# Patient Record
Sex: Female | Born: 1995 | Race: Asian | Hispanic: No | Marital: Single | Smoking: Never smoker
Health system: Southern US, Community
[De-identification: ages and names within clinical notes are randomized; demographics above are authoritative.]

---
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
## 2017-06-14 ENCOUNTER — Other Ambulatory Visit: Payer: Self-pay | Admitting: Family Medicine

## 2017-06-14 DIAGNOSIS — E049 Nontoxic goiter, unspecified: Secondary | ICD-10-CM

## 2017-06-21 ENCOUNTER — Ambulatory Visit
Admission: RE | Admit: 2017-06-21 | Discharge: 2017-06-21 | Disposition: A | Discharge status: Home / Self Care | Payer: BLUE CROSS/BLUE SHIELD | Source: Ambulatory Visit | Attending: Family Medicine | Admitting: Family Medicine

## 2017-06-21 DIAGNOSIS — E049 Nontoxic goiter, unspecified: Secondary | ICD-10-CM

## 2018-04-15 IMAGING — US US THYROID
1 series · 14 of 25 positions shown · non-contrast
Comparison: None.

CLINICAL DATA: 20-year-old female with a history of enlarged
thyroid

EXAM:
THYROID ULTRASOUND
TECHNIQUE: Ultrasound examination of the thyroid gland and adjacent soft
tissues was performed.

[Series 1: us thyroid · 0.04mm/px · 14 of 46 slices shown]
[im 1/46]
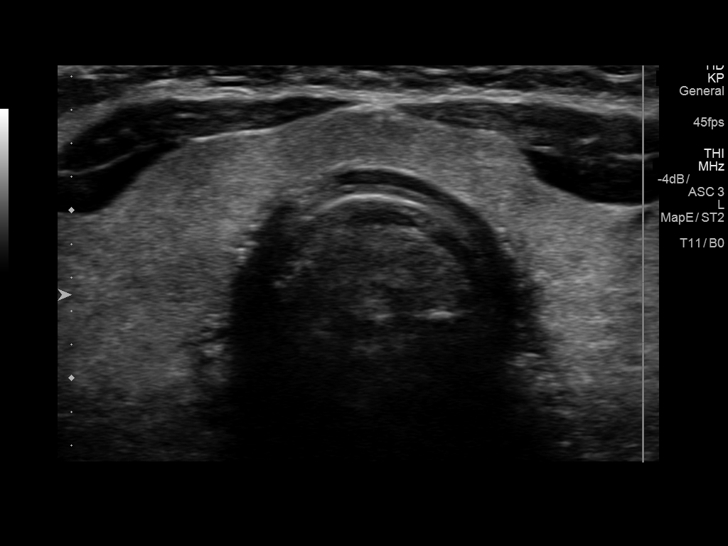
[im 4/46]
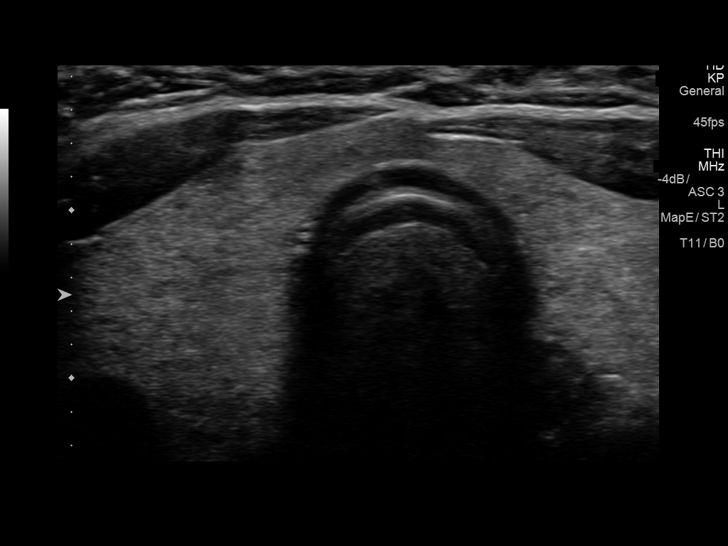
[im 8/46]
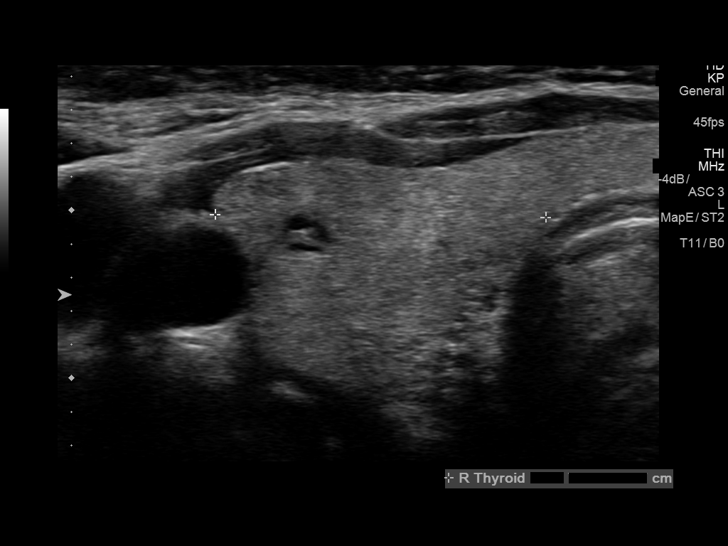
[im 12/46]
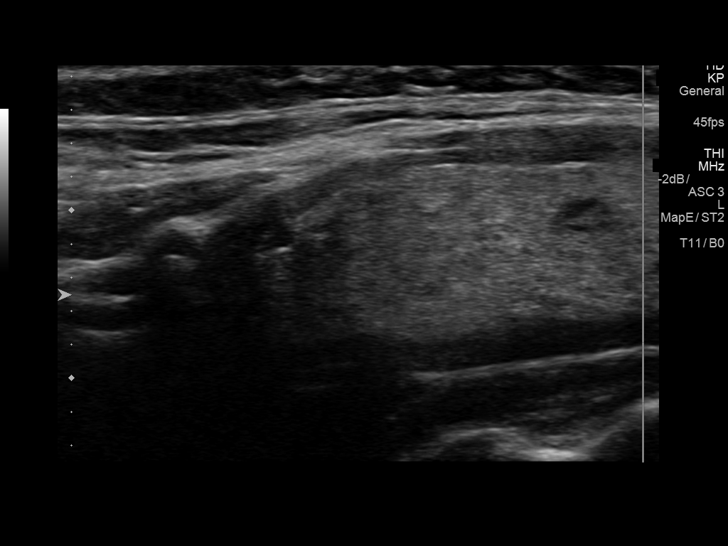
[im 16/46]
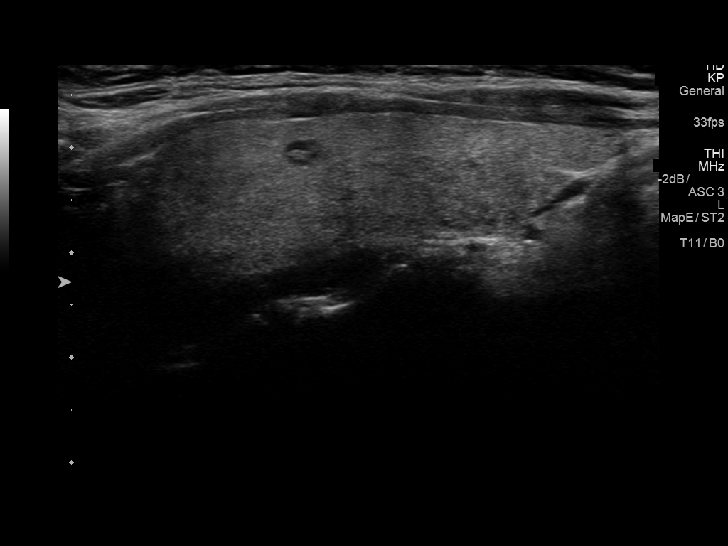
[im 17/46]
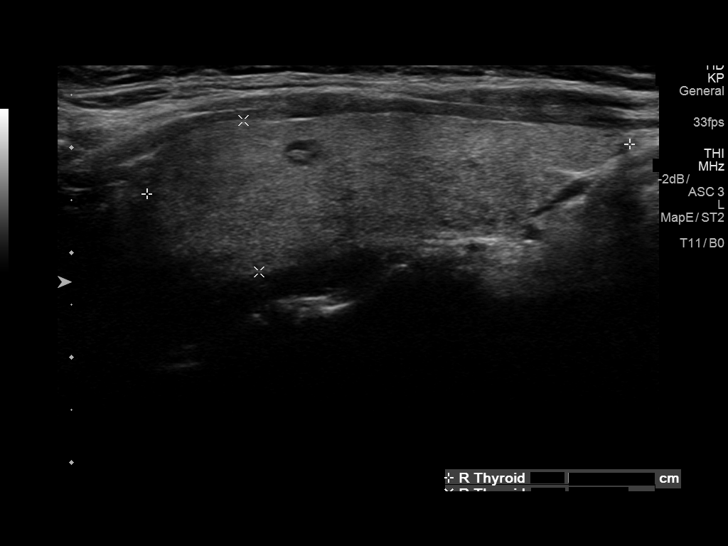
[im 21/46]
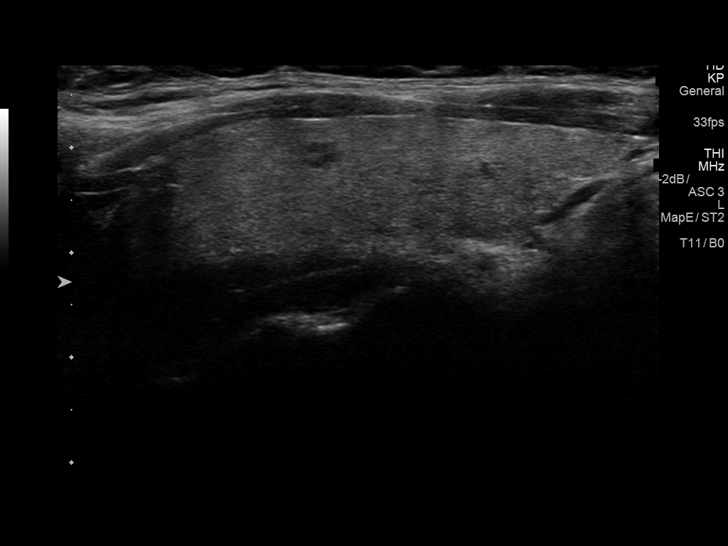
[im 25/46]
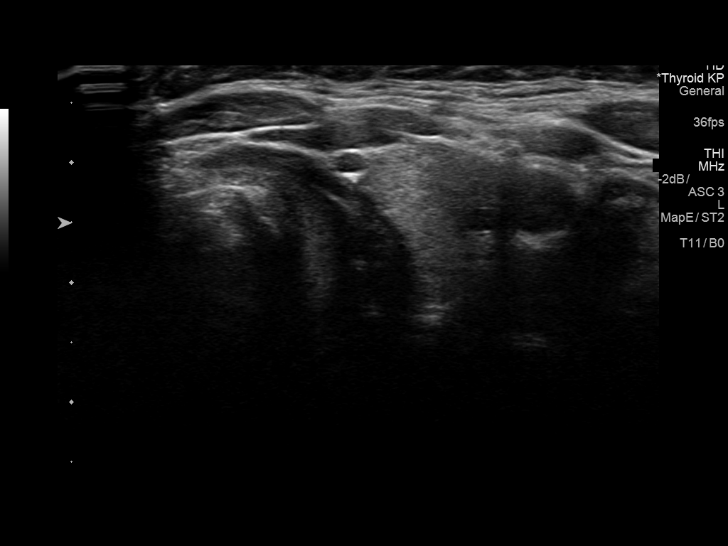
[im 29/46]
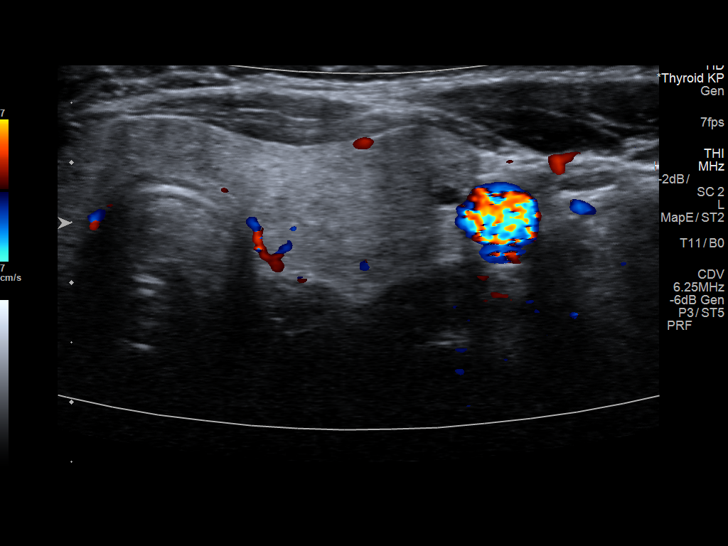
[im 31/46]
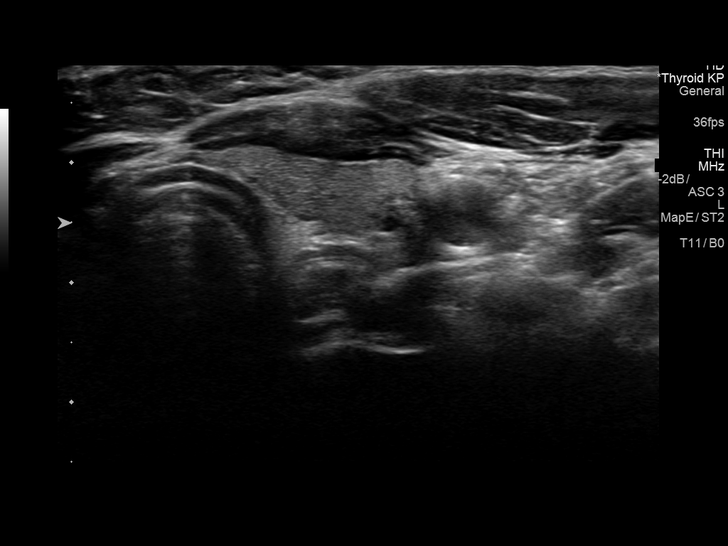
[im 34/46]
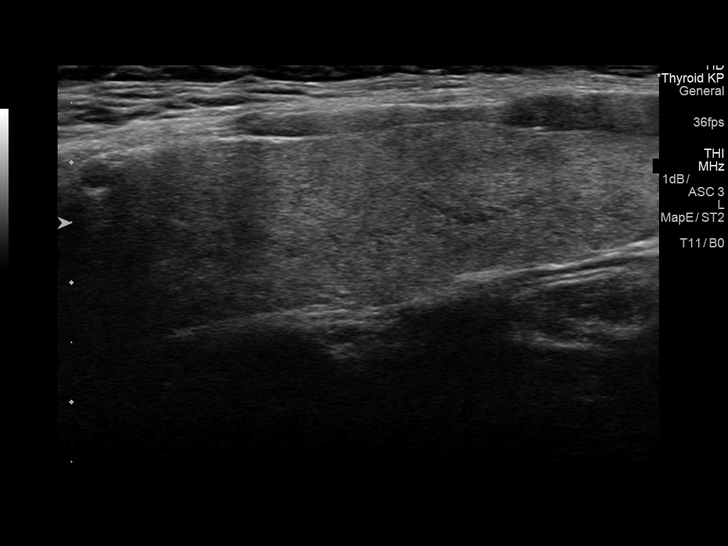
[im 38/46]
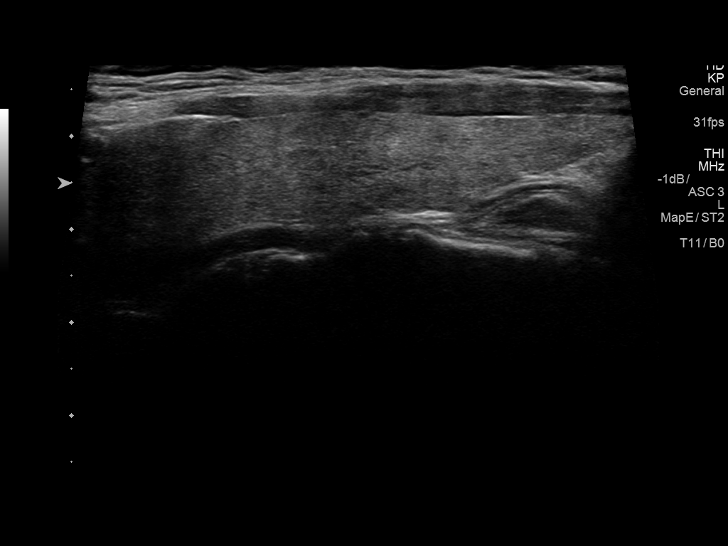
[im 42/46]
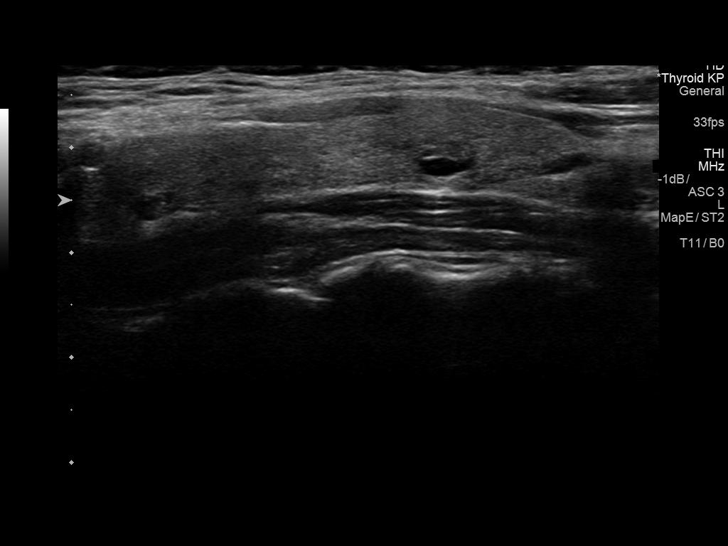
[im 46/46]
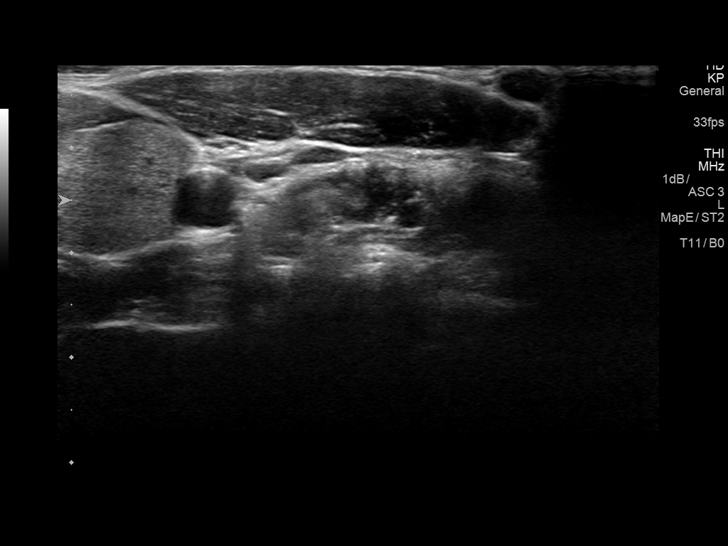

[14 of 25 positions shown; findings below may reference images not displayed]

FINDINGS: Parenchymal Echotexture: Mildly heterogenous

Isthmus: 0.3 cm

Right lobe: 4.6 cm x 1.5 cm x 2.0 cm

Left lobe: 5.9 cm x 1.3 cm x 2.1 cm

_________________________________________________________

Estimated total number of nodules >/= 1 cm: 0

Number of spongiform nodules >/=  2 cm not described below (TR1): 0

Number of mixed cystic and solid nodules >/= 1.5 cm not described
below (TR2): 0

_________________________________________________________

Bilateral benign cysts/ colloid nodules.

No adenopathy
IMPRESSION: No thyroid nodule meets criteria for biopsy or surveillance, as
designated by the newly established ACR TI-RADS criteria.

Recommendations follow those established by the new ACR TI-RADS
criteria ([HOSPITAL] 6664;[DATE]).

## 2021-03-09 ENCOUNTER — Other Ambulatory Visit (HOSPITAL_COMMUNITY)
Admission: RE | Admit: 2021-03-09 | Discharge: 2021-03-09 | Disposition: A | Discharge status: Home / Self Care | Payer: BC Managed Care – PPO | Source: Ambulatory Visit | Attending: Family Medicine | Admitting: Family Medicine

## 2021-03-09 ENCOUNTER — Other Ambulatory Visit: Payer: Self-pay | Admitting: Family Medicine

## 2021-03-09 DIAGNOSIS — Z0000 Encounter for general adult medical examination without abnormal findings: Secondary | ICD-10-CM | POA: Diagnosis not present

## 2021-03-11 LAB — CYTOLOGY - PAP: Diagnosis: NEGATIVE

## 2021-04-28 ENCOUNTER — Ambulatory Visit (INDEPENDENT_AMBULATORY_CARE_PROVIDER_SITE_OTHER): Payer: BC Managed Care – PPO | Admitting: Allergy

## 2021-04-28 ENCOUNTER — Other Ambulatory Visit: Payer: Self-pay

## 2021-04-28 ENCOUNTER — Encounter: Payer: Self-pay | Admitting: Allergy

## 2021-04-28 VITALS — BP 122/78 | HR 92 | Temp 97.9°F | Resp 18 | Ht 63.0 in | Wt 157.5 lb

## 2021-04-28 DIAGNOSIS — J3089 Other allergic rhinitis: Secondary | ICD-10-CM

## 2021-04-28 DIAGNOSIS — R12 Heartburn: Secondary | ICD-10-CM

## 2021-04-28 DIAGNOSIS — R053 Chronic cough: Secondary | ICD-10-CM | POA: Insufficient documentation

## 2021-04-28 DIAGNOSIS — J302 Other seasonal allergic rhinitis: Secondary | ICD-10-CM | POA: Insufficient documentation

## 2021-04-28 MED ORDER — AZELASTINE HCL 0.1 % NA SOLN: 1.0000 | Freq: Two times a day (BID) | NASAL | 5 refills | Status: DC | PRN

## 2021-04-28 MED ORDER — LEVOCETIRIZINE DIHYDROCHLORIDE 5 MG PO TABS: 5.0000 mg | ORAL_TABLET | Freq: Every evening | ORAL | 5 refills | Status: AC

## 2021-04-28 MED ORDER — ALBUTEROL SULFATE HFA 108 (90 BASE) MCG/ACT IN AERS: 2.0000 | INHALATION_SPRAY | RESPIRATORY_TRACT | 1 refills | Status: DC | PRN

## 2021-04-28 NOTE — Assessment & Plan Note (Signed)
Main complaint of chronic coughing with clear phlegm, chest tightness, shortness of breath, wheezing and nocturnal awakening for a few years.  Worse in the morning after brushing teeth and after meals.  Symptoms last for 1 to 2 hours.  Tried taking Nexium for 14 days with no benefit.  No recent chest x-ray.  Never tried inhalers.  Concerned about allergic triggers.  Today's skin testing showed: Positive to grass, trees, mold, dust mites, cat, cockroach. Borderline to feathers. Negative to common foods.  Today's spirometry was normal with 6% improvement in FEV1 post bronchodilator treatment.  Clinically feeling unchanged. . The most common causes of chronic cough include the following: upper airway cough syndrome (UACS) which is caused by variety of rhinitis conditions; asthma; gastroesophageal reflux disease (GERD); chronic bronchitis from cigarette smoking or other inhaled environmental irritants; non-asthmatic eosinophilic bronchitis; and bronchiectasis.   The history and physical examination suggest that her cough is multifactorial with contribution from heartburn, PND and possibly undiagnosed reactive airway disease.  . May use albuterol rescue inhaler 2 puffs or nebulizer every 4 to 6 hours as needed for shortness of breath, chest tightness, coughing, and wheezing. Monitor frequency of use and see if symptoms improve.  Start environmental control measures as below.  Use over the counter antihistamines such as Xyzal (levocetirizine) daily as needed. May take twice a day during allergy flares. May switch antihistamines every few months.  Use azelastine nasal spray 1-2 sprays per nostril twice a day as needed for runny nose/drainage.  Nasal saline spray (i.e., Simply Saline) or nasal saline lavage (i.e., NeilMed) is recommended as needed and prior to medicated nasal sprays.  See handout for lifestyle and dietary modifications.

## 2021-04-28 NOTE — Assessment & Plan Note (Signed)
See handout for lifestyle and dietary modifications. 

## 2021-04-28 NOTE — Progress Notes (Signed)
New Patient Note  RE: Sonya Bullock MRN: [id number] DOB: Mar 17, 1996 Date of Office Visit: 04/28/2021  Consult requested by: Deatra James, MD Primary care provider: Deatra James, MD  Chief Complaint: Cough (Coughing for a couple of years and no allergy medications work, after she eats, brush teeth )  History of Present Illness: I had the pleasure of seeing Sonya Bullock for initial evaluation at the [facility] on 04/28/2021. She is a 25 y.o. female, who is referred here by Deatra James, MD for the evaluation of cough.  She reports symptoms of coughing with clear phlegm, chest tightness, shortness of breath, wheezing, nocturnal awakenings for a few years. Worse in the mornings after she brushes her teeth and after meals. Sometimes it lasts 1-2 hours before symptoms resolve.   Current medications include none. She tried the following inhalers: none. In the last month, frequency of symptoms: daily. Interference with physical activity: yes. Sleep is undisturbed. In the last 12 months, emergency room visits/urgent care visits/doctor office visits or hospitalizations due to respiratory issues: no. In the last 12 months, oral steroids courses: no. Lifetime history of hospitalization for respiratory issues: no. Prior intubations: no. History of pneumonia: no. She was not evaluated by allergist/pulmonologist in the past. Smoking exposure: no. Up to date with flu vaccine: yes. Up to date with COVID-19 vaccine: yes. Prior Covid-19 infection: 2 years ago. History of reflux: sometimes and took Nexium for 14 days with some benefit but the coughing persisted. No recent CXR.  She reports symptoms of PND, nasal congestion, rhinorrhea. Symptoms have been going on for 2 years. The symptoms are present all year around with worsening in winter and fall. Anosmia: no. Headache: no. She has used allegra, Claritin, zyrtec, Flonase with minimal  improvement in symptoms. Sinus infections: no. Previous work  up includes: no. Previous ENT evaluation: no. Previous sinus imaging: no. History of nasal polyps: no.  Assessment and Plan: Sonya Bullock is a 25 y.o. female with: Chronic coughing Main complaint of chronic coughing with clear phlegm, chest tightness, shortness of breath, wheezing and nocturnal awakening for a few years.  Worse in the morning after brushing teeth and after meals.  Symptoms last for 1 to 2 hours.  Tried taking Nexium for 14 days with no benefit.  No recent chest x-ray.  Never tried inhalers.  Concerned about allergic triggers. Today's skin testing showed: Positive to grass, trees, mold, dust mites, cat, cockroach. Borderline to feathers. Negative to common foods. Today's spirometry was normal with 6% improvement in FEV1 post bronchodilator treatment.  Clinically feeling unchanged. The most common causes of chronic cough include the following: upper airway cough syndrome (UACS) which is caused by variety of rhinitis conditions; asthma; gastroesophageal reflux disease (GERD); chronic bronchitis from cigarette smoking or other inhaled environmental irritants; non-asthmatic eosinophilic bronchitis; and bronchiectasis.  The history and physical examination suggest that her cough is multifactorial with contribution from heartburn, PND and possibly undiagnosed reactive airway disease.  May use albuterol rescue inhaler 2 puffs or nebulizer every 4 to 6 hours as needed for shortness of breath, chest tightness, coughing, and wheezing. Monitor frequency of use and see if symptoms improve. Start environmental control measures as below. Use over the counter antihistamines such as Xyzal (levocetirizine) daily as needed. May take twice a day during allergy flares. May switch antihistamines every few months. Use azelastine nasal spray 1-2 sprays per nostril twice a day as needed for runny nose/drainage. Nasal saline spray (i.e., Simply Saline) or nasal saline  lavage (i.e., NeilMed) is recommended as  needed and prior to medicated nasal sprays. See handout for lifestyle and dietary modifications.  Other allergic rhinitis Perennial rhinitis symptoms for 2 years with worse in the winter and fall.  Tried over-the-counter antihistamines and Flonase with minimal benefit.  No prior allergy/ENT evaluation. Today's skin testing was: Positive to grass, trees, mold, dust mites, cat, cockroach. Borderline to feathers. Start environmental control measures as below. Use over the counter antihistamines such as Xyzal (levocetirizine) daily as needed. May take twice a day during allergy flares. May switch antihistamines every few months. Use azelastine nasal spray 1-2 sprays per nostril twice a day as needed for runny nose/drainage. Nasal saline spray (i.e., Simply Saline) or nasal saline lavage (i.e., NeilMed) is recommended as needed and prior to medicated nasal sprays. Consider allergy injections for long term control if above medications do not help the symptoms - handout given.   Heartburn See handout for lifestyle and dietary modifications.  Return in about 2 months (around 06/28/2021).  Meds ordered this encounter  Medications   levocetirizine (XYZAL ALLERGY 24HR) 5 MG tablet    Sig: Take 1 tablet (5 mg total) by mouth every evening.    Dispense:  30 tablet    Refill:  5   azelastine (ASTELIN) 0.1 % nasal spray    Sig: Place 1-2 sprays into both nostrils 2 (two) times daily as needed (nasal drainage). Use in each nostril as directed    Dispense:  30 mL    Refill:  5   albuterol (VENTOLIN HFA) 108 (90 Base) MCG/ACT inhaler    Sig: Inhale 2 puffs into the lungs every 4 (four) hours as needed for wheezing or shortness of breath (coughing fits).    Dispense:  18 g    Refill:  1    Lab Orders  No laboratory test(s) ordered today    Other allergy screening: Food allergy: no Medication allergy: no Hymenoptera allergy: no Urticaria: no Eczema:no History of recurrent infections suggestive  of immunodeficency: no  Diagnostics: Spirometry:  Tracings reviewed. Her effort: Good reproducible efforts. FVC: 3.11L FEV1: 2.68L, 96% predicted FEV1/FVC ratio: 86% Interpretation: Spirometry consistent with normal pattern with 6% improvement in FEV1 post bronchodilator treatment. Clinically feeling unchanged.   Please see scanned spirometry results for details.  Skin Testing: Environmental allergy panel and select foods. Positive to grass, trees, mold, dust mites, cat, cockroach. Borderline to feathers. Negative to common foods. Results discussed with patient/family.  Airborne Adult Perc - 04/28/21 1421     Time Antigen Placed 1422    Allergen Manufacturer Waynette Buttery    Location Back    Number of Test 59    1. Control-Buffer 50% Glycerol Negative    2. Control-Histamine 1 mg/ml 2+    3. Albumin saline Negative    4. Bahia Negative    5. French Southern Territories Negative    6. Johnson 2+    7. Kentucky Blue 2+    8. Meadow Fescue Negative    9. Perennial Rye Negative    10. Sweet Vernal Negative    11. Timothy 2+    12. Cocklebur Negative    13. Burweed Marshelder Negative    14. Ragweed, short Negative    15. Ragweed, Giant Negative    16. Plantain,  English Negative    17. Lamb's Quarters Negative    18. Sheep Sorrell Negative    19. Rough Pigweed Negative    20. Marsh Elder, Rough Negative    21. Mugwort, Common  Negative    22. Ash mix Negative    23. Birch mix Negative    24. Beech American Negative    25. Box, Elder Negative    26. Cedar, red Negative    27. Cottonwood, Guinea[place]au Negative    28. Elm mix Negative    29. Hickory 4+    30. Maple mix Negative    31. Oak, [place] mix Negative    32. Pecan Pollen 4+    33. Pine mix Negative    34. Sycamore Guinea[place]iss[place] --   +/-   35. Walnut, Black Pollen Negative    36. Alternaria alternata Negative    37. Cladosporium Herbarum Negative    38. Aspergillus mix Negative    39. Penicillium mix Negative    40. Bipolaris sorokiniana  (Helminthosporium) Negative    41. Drechslera spicifera (Curvularia) Negative    42. Mucor plumbeus Negative    43. Fusarium moniliforme Negative    44. Aureobasidium pullulans (pullulara) Negative    45. Rhizopus oryzae Negative    46. Botrytis cinera Negative    47. Epicoccum nigrum Negative    48. Phoma betae Negative    49. Candida Albicans 2+    50. Trichophyton mentagrophytes Negative    51. Mite, D Farinae  5,000 AU/ml 3+    52. Mite, D Pteronyssinus  5,000 AU/ml Negative    53. Cat Hair 10,000 BAU/ml Negative    54.  Dog Epithelia Negative    55. Mixed Feathers --   +/-   56. Horse Epithelia Negative    57. Cockroach, German Negative    58. Mouse Negative    59. Tobacco Leaf Negative             Food Perc - 04/28/21 1422       Test Information   Time Antigen Placed 1422    Allergen Manufacturer Waynette Buttery    Location Back    Number of allergen test 10      Food   1. Peanut Negative    2. Soybean food Negative    3. Wheat, whole Negative    4. Sesame Negative    5. Milk, cow Negative    6. Egg White, chicken Negative    7. Casein Negative    8. Shellfish mix Negative    9. Fish mix Negative    10. Cashew Negative             Intradermal - 04/28/21 1452     Time Antigen Placed 1452    Allergen Manufacturer Greer    Location Arm    Number of Test 11    Control Negative    French Southern Territ[place] Negative    Ragweed mix Negative    Weed mix Negative    Mold 1 Negative    Mold 2 Negative    Mold 3 --   +/-   Mold 4 --   +/-   Cat 4+    Dog Negative    Cockroach 3+             Past Medical History: Patient Active Problem List   Diagnosis Date Noted   Chronic coughing 04/28/2021   Other allergic rhinitis 04/28/2021   Heartburn 04/28/2021    History reviewed. No pertinent past medical history. Past Surgical History: History reviewed. No pertinent surgical history. Medication List:  Current Outpatient Medications  Medication Sig Dispense Refill    albuterol (VENTOLIN HFA) 108 (90 Base) MCG/ACT inhaler Inhale 2 puffs into the  lungs every 4 (four) hours as needed for wheezing or shortness of breath (coughing fits). 18 g 1   azelastine (ASTELIN) 0.1 % nasal spray Place 1-2 sprays into both nostrils 2 (two) times daily as needed (nasal drainage). Use in each nostril as directed 30 mL 5   levocetirizine (XYZAL ALLERGY 24HR) 5 MG tablet Take 1 tablet (5 mg total) by mouth every evening. 30 tablet 5   VIENVA 0.1-20 MG-MCG tablet Take 1 tablet by mouth daily.     Vitamin D, Ergocalciferol, (DRISDOL) 1.25 MG (50000 UNIT) CAPS capsule Take by mouth.     No current facility-administered medications for this visit.   Allergies: No Known Allergies Social History: Social History   Socioeconomic History   Marital status: Single    Spouse name: Not on file   Number of children: Not on file   Years of education: Not on file   Highest education level: Not on file  Occupational History   Not on file  Tobacco Use   Smoking status: Never   Smokeless tobacco: Never  Vaping Use   Vaping Use: Never used  Substance and Sexual Activity   Alcohol use: Not Currently    Alcohol/week: 3.0 standard drinks    Types: 3 Glasses of wine per week   Drug use: Never   Sexual activity: Not on file  Other Topics Concern   Not on file  Social History Narrative   Not on file   Social Determinants of Health   Financial Resource Strain: Not on file  Food Insecurity: Not on file  Transportation Needs: Not on file  Physical Activity: Not on file  Stress: Not on file  Social Connections: Not on file   Lives in a 23 year old house. Smoking: denies Occupation: Equities traderCNA  Environmental HistorySurveyor, minerals: Water Damage/mildew in the house: no Engineer, civil (consulting)Carpet in the family room: no Carpet in the bedroom: yes Heating:  gas and electric Cooling: central Pet: yes 2 cats x 1 month  Family History: Family History  Problem Relation Age of Onset   Asthma Mother    Urticaria Sister     Allergic rhinitis Neg Hx    Eczema Neg Hx    Problem                               Relation Asthma                                   Mother  Eczema                                No  Food allergy                          Sister  Allergic rhino conjunctivitis     No  Review of Systems  Constitutional:  Negative for appetite change, chills, fever and unexpected weight change.  HENT:  Positive for congestion, postnasal drip and rhinorrhea.   Eyes:  Negative for itching.  Respiratory:  Positive for cough. Negative for chest tightness, shortness of breath and wheezing.   Cardiovascular:  Negative for chest pain.  Gastrointestinal:  Negative for abdominal pain.  Genitourinary:  Negative for difficulty urinating.  Skin:  Negative for rash.  Allergic/Immunologic: Positive for environmental allergies.  Neurological:  Negative for headaches.   Objective: BP 122/78 (BP Location: Right Arm, Patient Position: Sitting, Cuff Size: Normal)   Pulse 92   Temp 97.9 F (36.6 C) (Temporal)   Resp 18   Ht  (1.6 m)   Wt 157 lb 8 oz (71.4 kg)   SpO2 99%   BMI 27.90 kg/m  Body mass index is 27.9 kg/m. Physical Exam Vitals and nursing note reviewed.  Constitutional:      Appearance: Normal appearance. She is well-developed.  HENT:     Head: Normocephalic and atraumatic.     Right Ear: External ear normal.     Left Ear: External ear normal.     Nose: Nose normal.     Mouth/Throat:     Mouth: Mucous membranes are moist.     Pharynx: Oropharynx is clear.  Eyes:     Conjunctiva/sclera: Conjunctivae normal.  Cardiovascular:     Rate and Rhythm: Normal rate and regular rhythm.     Heart sounds: Normal heart sounds. No murmur heard.   No friction rub. No gallop.  Pulmonary:     Effort: Pulmonary effort is normal.     Breath sounds: Normal breath sounds. No wheezing, rhonchi or rales.  Abdominal:     Palpations: Abdomen is soft.  Musculoskeletal:     Cervical back: Neck supple.   Skin:    General: Skin is warm.     Findings: No rash.  Neurological:     Mental Status: She is alert and oriented to person, place, and time.  Psychiatric:        Behavior: Behavior normal.  The plan was reviewed with the patient/family, and all questions/concerned were addressed.  It was my pleasure to see Sonya Bullock today and participate in her care. Please feel free to contact me with any questions or concerns.  Sincerely,  Wyline Mood, DO Allergy & Immunology  [facility] office: [phone] [facility] office: [phone]

## 2021-04-28 NOTE — Patient Instructions (Addendum)
Today's skin testing showed: Positive to grass, trees, mold, dust mites, cat, cockroach. Borderline to feathers. Negative to common foods. Results given.   Coughing: The most common causes of chronic cough include the following: upper airway cough syndrome (UACS) which is caused by variety of rhinitis conditions; asthma; gastroesophageal reflux disease (GERD); chronic bronchitis from cigarette smoking or other inhaled environmental irritants; non-asthmatic eosinophilic bronchitis; and bronchiectasis.  May use albuterol rescue inhaler 2 puffs or nebulizer every 4 to 6 hours as needed for shortness of breath, chest tightness, coughing, and wheezing. Monitor frequency of use.   Environmental allergies Start environmental control measures as below. Use over the counter antihistamines such as Xyzal (levocetirizine) daily as needed. May take twice a day during allergy flares. May switch antihistamines every few months. Use azelastine nasal spray 1-2 sprays per nostril twice a day as needed for runny nose/drainage. Nasal saline spray (i.e., Simply Saline) or nasal saline lavage (i.e., NeilMed) is recommended as needed and prior to medicated nasal sprays.  Consider allergy injections for long term control if above medications do not help the symptoms - handout given.   Heartburn: See handout for lifestyle and dietary modifications.  Follow up in 2 months or sooner if needed.   Reducing Pollen Exposure Pollen seasons: trees (spring), grass (summer) and ragweed/weeds (fall). Keep windows closed in your home and car to lower pollen exposure.  Install air conditioning in the bedroom and throughout the house if possible.  Avoid going out in dry windy days - especially early morning. Pollen counts are highest between 5 - 10 AM and on dry, hot and windy days.  Save outside activities for late afternoon or after a heavy rain, when pollen levels are lower.  Avoid mowing of grass if you have grass pollen  allergy. Be aware that pollen can also be transported indoors on people and pets.  Dry your clothes in an automatic dryer rather than hanging them outside where they might collect pollen.  Rinse hair and eyes before bedtime. Mold Control Mold and fungi can grow on a variety of surfaces provided certain temperature and moisture conditions exist.  Outdoor molds grow on plants, decaying vegetation and soil. The major outdoor mold, Alternaria and Cladosporium, are found in very high numbers during hot and dry conditions. Generally, a late summer - fall peak is seen for common outdoor fungal spores. Rain will temporarily lower outdoor mold spore count, but counts rise rapidly when the rainy period ends. The most important indoor molds are Aspergillus and Penicillium. Dark, humid and poorly ventilated basements are ideal sites for mold growth. The next most common sites of mold growth are the bathroom and the kitchen. Outdoor (Seasonal) Mold Control Use air conditioning and keep windows closed. Avoid exposure to decaying vegetation. Avoid leaf raking. Avoid grain handling. Consider wearing a face mask if working in moldy areas.  Indoor (Perennial) Mold Control  Maintain humidity below 50%. Get rid of mold growth on hard surfaces with water, detergent and, if necessary, 5% bleach (do not mix with other cleaners). Then dry the area completely. If mold covers an area more than 10 square feet, consider hiring an indoor environmental professional. For clothing, washing with soap and water is best. If moldy items cannot be cleaned and dried, throw them away. Remove sources e.g. contaminated carpets. Repair and seal leaking roofs or pipes. Using dehumidifiers in damp basements may be helpful, but empty the water and clean units regularly to prevent mildew from forming. All rooms, especially basements,  bathrooms and kitchens, require ventilation and cleaning to deter mold and mildew growth. Avoid carpeting on  concrete or damp floors, and storing items in damp areas. Control of House Dust Mite Allergen Dust mite allergens are a common trigger of allergy and asthma symptoms. While they can be found throughout the house, these microscopic creatures thrive in warm, humid environments such as bedding, upholstered furniture and carpeting. Because so much time is spent in the bedroom, it is essential to reduce mite levels there.  Encase pillows, mattresses, and box springs in special allergen-proof fabric covers or airtight, zippered plastic covers.  Bedding should be washed weekly in hot water (130 F) and dried in a hot dryer. Allergen-proof covers are available for comforters and pillows that can't be regularly washed.  Wash the allergy-proof covers every few months. Minimize clutter in the bedroom. Keep pets out of the bedroom.  Keep humidity less than 50% by using a dehumidifier or air conditioning. You can buy a humidity measuring device called a hygrometer to monitor this.  If possible, replace carpets with hardwood, linoleum, or washable area rugs. If that's not possible, vacuum frequently with a vacuum that has a HEPA filter. Remove all upholstered furniture and non-washable window drapes from the bedroom. Remove all non-washable stuffed toys from the bedroom.  Wash stuffed toys weekly. Pet Allergen Avoidance: Contrary to popular opinion, there are no "hypoallergenic" breeds of dogs or cats. That is because people are not allergic to an animal's hair, but to an allergen found in the animal's saliva, dander (dead skin flakes) or urine. Pet allergy symptoms typically occur within minutes. For some people, symptoms can build up and become most severe 8 to 12 hours after contact with the animal. People with severe allergies can experience reactions in public places if dander has been transported on the pet owners' clothing. Keeping an animal outdoors is only a partial solution, since homes with pets in the  yard still have higher concentrations of animal allergens. Before getting a pet, ask your allergist to determine if you are allergic to animals. If your pet is already considered part of your family, try to minimize contact and keep the pet out of the bedroom and other rooms where you spend a great deal of time. As with dust mites, vacuum carpets often or replace carpet with a hardwood floor, tile or linoleum. High-efficiency particulate air (HEPA) cleaners can reduce allergen levels over time. While dander and saliva are the source of cat and dog allergens, urine is the source of allergens from rabbits, hamsters, mice and [place] pigs; so ask a non-allergic family member to clean the animal's cage. If you have a pet allergy, talk to your allergist about the potential for allergy immunotherapy (allergy shots). This strategy can often provide long-term relief. Cockroach Allergen Avoidance Cockroaches are often found in the homes of densely populated urban areas, schools or commercial buildings, but these creatures can lurk almost anywhere. This does not mean that you have a dirty house or living area. Block all areas where roaches can enter the home. This includes crevices, wall cracks and windows.  Cockroaches need water to survive, so fix and seal all leaky faucets and pipes. Have an exterminator go through the house when your family and pets are gone to eliminate any remaining roaches. Keep food in lidded containers and put pet food dishes away after your pets are done eating. Vacuum and sweep the floor after meals, and take out garbage and recyclables. Use lidded garbage containers  in the kitchen. Wash dishes immediately after use and clean under stoves, refrigerators or toasters where crumbs can accumulate. Wipe off the stove and other kitchen surfaces and cupboards regularly.

## 2021-04-28 NOTE — Assessment & Plan Note (Signed)
Perennial rhinitis symptoms for 2 years with worse in the winter and fall.  Tried over-the-counter antihistamines and Flonase with minimal benefit.  No prior allergy/ENT evaluation.  Today's skin testing was: Positive to grass, trees, mold, dust mites, cat, cockroach. Borderline to feathers.  Start environmental control measures as below.  Use over the counter antihistamines such as Xyzal (levocetirizine) daily as needed. May take twice a day during allergy flares. May switch antihistamines every few months.  Use azelastine nasal spray 1-2 sprays per nostril twice a day as needed for runny nose/drainage.  Nasal saline spray (i.e., Simply Saline) or nasal saline lavage (i.e., NeilMed) is recommended as needed and prior to medicated nasal sprays.  Consider allergy injections for long term control if above medications do not help the symptoms - handout given.

## 2021-05-25 ENCOUNTER — Other Ambulatory Visit: Payer: Self-pay | Admitting: Allergy

## 2021-06-29 NOTE — Progress Notes (Signed)
Follow Up Note  RE: Sonya Bullock MRN: [id number] DOB: Mar 18, 1996 Date of Office Visit: 06/30/2021  Referring provider: Deatra James, MD Primary care provider: Deatra James, MD  Chief Complaint: Cough  History of Present Illness: I had the pleasure of seeing Sonya Bullock for a follow up visit at the [facility] on 06/30/2021. She is a 25 y.o. female, who is being followed for allergic rhinitis, coughing and heartburn. Her previous allergy office visit was on 04/28/2021 with Dr. Selena Batten. Today is a regular follow up visit.  Chronic coughing Coughing is about the same with lot of clear phlegm. This is worse after eating and at night. No prior reflux medications for long term.  Used albuterol a few times which helped only the chest tightness part after a coughing fit but not the coughing. Using albuterol twice per day.   Other allergic rhinitis Currently taking Xyzal once a day, azelastine 2 sprays per nostril once a day every other day with some benefit - but this did not help with the coughing.  Assessment and Plan: Sonya Bullock is a 25 y.o. female with: Chronic coughing Past history - Main complaint of chronic coughing with clear phlegm, chest tightness, shortness of breath, wheezing and nocturnal awakening for a few years.  Worse in the morning after brushing teeth and after meals.  Symptoms last for 1 to 2 hours.  Tried taking Nexium for 14 days with no benefit.  No recent chest x-ray.  Never tried inhalers.   Interim history - azelastine helped nasal symptoms but not the coughing, albuterol helped the chest tightness but no the coughing. Worse at night and after meals. Today's spirometry was normal. Given clinical history will do a trial of PPI and ICS/LABA. Daily controller medication(s): START Airduo digihaler 1 puff twice a day and rinse mouth after each use. Sample given and demonstrated proper use. May use albuterol rescue inhaler 2 puffs every 4 to 6 hours  as needed for shortness of breath, chest tightness, coughing, and wheezing. Monitor frequency of use.  Get spirometry at next visit. If no improvement, will refer to ENT next to look at vocal cords and get CXR.  Seasonal and perennial allergic rhinitis Past history - Perennial rhinitis symptoms for 2 years with worse in the winter and fall.  No prior ENT evaluation. 2022 skin testing was: Positive to grass, trees, mold, dust mites, cat, cockroach. Borderline to feathers. Interim history - azelastine helping but coughing is the same. Continue environmental control measures as below. Use over the counter antihistamines such as Xyzal (levocetirizine) daily as needed. May take twice a day during allergy flares. May switch antihistamines every few months. Use azelastine nasal spray 1-2 sprays per nostril twice a day as needed for runny nose/drainage. Nasal saline spray (i.e., Simply Saline) or nasal saline lavage (i.e., NeilMed) is recommended as needed and prior to medicated nasal sprays. Consider allergy injections for long term control if above medications do not help the symptoms.  Heartburn Continue lifestyle and dietary modifications. Start omeprazole 20mg  daily in the mornings and nothing to eat or drink.   Return in about 2 months (around 08/30/2021).  Meds ordered this encounter  Medications   omeprazole (PRILOSEC) 20 MG capsule    Sig: Take 1 capsule (20 mg total) by mouth daily.    Dispense:  30 capsule    Refill:  3   Fluticasone-Salmeterol,sensor, (AIRDUO DIGIHALER) 113-14 MCG/ACT AEPB    Sig: Inhale 1 puff into the lungs  in the morning and at bedtime. Rinse mouth after each use.    Dispense:  1 each    Refill:  3    Patient bringing coupon.    Lab Orders  No laboratory test(s) ordered today    Diagnostics: Spirometry:  Tracings reviewed. Her effort: It was hard to get consistent efforts and there is a question as to whether this reflects a maximal maneuver. FVC:  3.13L FEV1: 2.76L, 99% predicted FEV1/FVC ratio: 88% Interpretation: Spirometry consistent with normal pattern.  Please see scanned spirometry results for details.  Medication List:  Current Outpatient Medications  Medication Sig Dispense Refill   albuterol (VENTOLIN HFA) 108 (90 Base) MCG/ACT inhaler INHALE 2 PUFFS INTO THE LUNGS EVERY 4 HOURS AS NEEDED FOR WHEEZING OR SHORTNESS OF BREATH 8.5 g 1   azelastine (ASTELIN) 0.1 % nasal spray Place 1-2 sprays into both nostrils 2 (two) times daily as needed (nasal drainage). Use in each nostril as directed 30 mL 5   Fluticasone-Salmeterol,sensor, (AIRDUO DIGIHALER) 113-14 MCG/ACT AEPB Inhale 1 puff into the lungs in the morning and at bedtime. Rinse mouth after each use. 1 each 3   levocetirizine (XYZAL ALLERGY 24HR) 5 MG tablet Take 1 tablet (5 mg total) by mouth every evening. 30 tablet 5   omeprazole (PRILOSEC) 20 MG capsule Take 1 capsule (20 mg total) by mouth daily. 30 capsule 3   VIENVA 0.1-20 MG-MCG tablet Take 1 tablet by mouth daily.     Vitamin D, Ergocalciferol, (DRISDOL) 1.25 MG (50000 UNIT) CAPS capsule Take by mouth.     No current facility-administered medications for this visit.   Allergies: No Known Allergies I reviewed her past medical history, social history, family history, and environmental history and no significant changes have been reported from her previous visit.  Review of Systems  Constitutional:  Negative for appetite change, chills, fever and unexpected weight change.  HENT:  Negative for congestion, postnasal drip and rhinorrhea.   Eyes:  Negative for itching.  Respiratory:  Positive for cough, chest tightness and shortness of breath. Negative for wheezing.   Cardiovascular:  Negative for chest pain.  Gastrointestinal:  Negative for abdominal pain.  Genitourinary:  Negative for difficulty urinating.  Skin:  Negative for rash.  Allergic/Immunologic: Positive for environmental allergies.  Neurological:   Negative for headaches.   Objective: BP 110/70   Pulse 90   Temp 98.2 F (36.8 C) (Temporal)   Resp 16   SpO2 99%  There is no height or weight on file to calculate BMI. Physical Exam Vitals and nursing note reviewed.  Constitutional:      Appearance: Normal appearance. She is well-developed.  HENT:     Head: Normocephalic and atraumatic.     Right Ear: External ear normal.     Left Ear: External ear normal.     Nose: Nose normal.     Mouth/Throat:     Mouth: Mucous membranes are moist.     Pharynx: Oropharynx is clear.  Eyes:     Conjunctiva/sclera: Conjunctivae normal.  Cardiovascular:     Rate and Rhythm: Normal rate and regular rhythm.     Heart sounds: Normal heart sounds. No murmur heard.   No friction rub. No gallop.  Pulmonary:     Effort: Pulmonary effort is normal.     Breath sounds: Normal breath sounds. No wheezing, rhonchi or rales.  Musculoskeletal:     Cervical back: Neck supple.  Skin:    General: Skin is warm.  Findings: No rash.  Neurological:     Mental Status: She is alert and oriented to person, place, and time.  Psychiatric:        Behavior: Behavior normal.  Previous notes and tests were reviewed. The plan was reviewed with the patient/family, and all questions/concerned were addressed.  It was my pleasure to see Sonya Bullock today and participate in her care. Please feel free to contact me with any questions or concerns.  Sincerely,  Wyline Mood, DO Allergy & Immunology  [facility] office: [phone] [facility] office: [phone]

## 2021-06-30 ENCOUNTER — Other Ambulatory Visit: Payer: Self-pay

## 2021-06-30 ENCOUNTER — Encounter: Payer: Self-pay | Admitting: Allergy

## 2021-06-30 ENCOUNTER — Ambulatory Visit (INDEPENDENT_AMBULATORY_CARE_PROVIDER_SITE_OTHER): Payer: BC Managed Care – PPO | Admitting: Allergy

## 2021-06-30 VITALS — BP 110/70 | HR 90 | Temp 98.2°F | Resp 16

## 2021-06-30 DIAGNOSIS — J3089 Other allergic rhinitis: Secondary | ICD-10-CM | POA: Diagnosis not present

## 2021-06-30 DIAGNOSIS — J302 Other seasonal allergic rhinitis: Secondary | ICD-10-CM

## 2021-06-30 DIAGNOSIS — R12 Heartburn: Secondary | ICD-10-CM | POA: Diagnosis not present

## 2021-06-30 DIAGNOSIS — R053 Chronic cough: Secondary | ICD-10-CM | POA: Diagnosis not present

## 2021-06-30 MED ORDER — OMEPRAZOLE 20 MG PO CPDR: 20.0000 mg | DELAYED_RELEASE_CAPSULE | Freq: Every day | ORAL | 3 refills | Status: DC

## 2021-06-30 MED ORDER — AIRDUO DIGIHALER 113-14 MCG/ACT IN AEPB: 1.0000 | INHALATION_SPRAY | Freq: Two times a day (BID) | RESPIRATORY_TRACT | 3 refills | Status: DC

## 2021-06-30 NOTE — Assessment & Plan Note (Signed)
Past history - Perennial rhinitis symptoms for 2 years with worse in the winter and fall.  No prior ENT evaluation. 2022 skin testing was: Positive to grass, trees, mold, dust mites, cat, cockroach. Borderline to feathers. Interim history - azelastine helping but coughing is the same.  Continue environmental control measures as below.  Use over the counter antihistamines such as Xyzal (levocetirizine) daily as needed. May take twice a day during allergy flares. May switch antihistamines every few months.  Use azelastine nasal spray 1-2 sprays per nostril twice a day as needed for runny nose/drainage.  Nasal saline spray (i.e., Simply Saline) or nasal saline lavage (i.e., NeilMed) is recommended as needed and prior to medicated nasal sprays.  Consider allergy injections for long term control if above medications do not help the symptoms.

## 2021-06-30 NOTE — Patient Instructions (Addendum)
Coughing: Daily controller medication(s): START Airduo digihaler 1 puff twice a day and rinse mouth after each use. Sample given and demonstrated proper use. May use albuterol rescue inhaler 2 puffs every 4 to 6 hours as needed for shortness of breath, chest tightness, coughing, and wheezing. Monitor frequency of use.  Breathing control goals:  Full participation in all desired activities (may need albuterol before activity) Albuterol use two times or less a week on average (not counting use with activity) Cough interfering with sleep two times or less a month Oral steroids no more than once a year No hospitalizations   Environmental allergies Past skin testing showed: Positive to grass, trees, mold, dust mites, cat, cockroach. Borderline to feathers. Continue environmental control measures as below. Use over the counter antihistamines such as Xyzal (levocetirizine) daily as needed. May take twice a day during allergy flares. May switch antihistamines every few months. Use azelastine nasal spray 1-2 sprays per nostril twice a day as needed for runny nose/drainage. Nasal saline spray (i.e., Simply Saline) or nasal saline lavage (i.e., NeilMed) is recommended as needed and prior to medicated nasal sprays. Consider allergy injections for long term control if above medications do not help the symptoms.  Heartburn: Continue lifestyle and dietary modifications. Start omeprazole 20mg  daily in the mornings and nothing to eat or drink.   Follow up in 2 months or sooner if needed.   Reducing Pollen Exposure Pollen seasons: trees (spring), grass (summer) and ragweed/weeds (fall). Keep windows closed in your home and car to lower pollen exposure.  Install air conditioning in the bedroom and throughout the house if possible.  Avoid going out in dry windy days - especially early morning. Pollen counts are highest between 5 - 10 AM and on dry, hot and windy days.  Save outside activities for  late afternoon or after a heavy rain, when pollen levels are lower.  Avoid mowing of grass if you have grass pollen allergy. Be aware that pollen can also be transported indoors on people and pets.  Dry your clothes in an automatic dryer rather than hanging them outside where they might collect pollen.  Rinse hair and eyes before bedtime. Mold Control Mold and fungi can grow on a variety of surfaces provided certain temperature and moisture conditions exist.  Outdoor molds grow on plants, decaying vegetation and soil. The major outdoor mold, Alternaria and Cladosporium, are found in very high numbers during hot and dry conditions. Generally, a late summer - fall peak is seen for common outdoor fungal spores. Rain will temporarily lower outdoor mold spore count, but counts rise rapidly when the rainy period ends. The most important indoor molds are Aspergillus and Penicillium. Dark, humid and poorly ventilated basements are ideal sites for mold growth. The next most common sites of mold growth are the bathroom and the kitchen. Outdoor (Seasonal) Mold Control Use air conditioning and keep windows closed. Avoid exposure to decaying vegetation. Avoid leaf raking. Avoid grain handling. Consider wearing a face mask if working in moldy areas.  Indoor (Perennial) Mold Control  Maintain humidity below 50%. Get rid of mold growth on hard surfaces with water, detergent and, if necessary, 5% bleach (do not mix with other cleaners). Then dry the area completely. If mold covers an area more than 10 square feet, consider hiring an indoor environmental professional. For clothing, washing with soap and water is best. If moldy items cannot be cleaned and dried, throw them away. Remove sources e.g. contaminated carpets. Repair and seal leaking  roofs or pipes. Using dehumidifiers in damp basements may be helpful, but empty the water and clean units regularly to prevent mildew from forming. All rooms, especially  basements, bathrooms and kitchens, require ventilation and cleaning to deter mold and mildew growth. Avoid carpeting on concrete or damp floors, and storing items in damp areas. Control of House Dust Mite Allergen Dust mite allergens are a common trigger of allergy and asthma symptoms. While they can be found throughout the house, these microscopic creatures thrive in warm, humid environments such as bedding, upholstered furniture and carpeting. Because so much time is spent in the bedroom, it is essential to reduce mite levels there.  Encase pillows, mattresses, and box springs in special allergen-proof fabric covers or airtight, zippered plastic covers.  Bedding should be washed weekly in hot water (130 F) and dried in a hot dryer. Allergen-proof covers are available for comforters and pillows that can't be regularly washed.  Wash the allergy-proof covers every few months. Minimize clutter in the bedroom. Keep pets out of the bedroom.  Keep humidity less than 50% by using a dehumidifier or air conditioning. You can buy a humidity measuring device called a hygrometer to monitor this.  If possible, replace carpets with hardwood, linoleum, or washable area rugs. If that's not possible, vacuum frequently with a vacuum that has a HEPA filter. Remove all upholstered furniture and non-washable window drapes from the bedroom. Remove all non-washable stuffed toys from the bedroom.  Wash stuffed toys weekly. Pet Allergen Avoidance: Contrary to popular opinion, there are no "hypoallergenic" breeds of dogs or cats. That is because people are not allergic to an animal's hair, but to an allergen found in the animal's saliva, dander (dead skin flakes) or urine. Pet allergy symptoms typically occur within minutes. For some people, symptoms can build up and become most severe 8 to 12 hours after contact with the animal. People with severe allergies can experience reactions in public places if dander has been  transported on the pet owners' clothing. Keeping an animal outdoors is only a partial solution, since homes with pets in the yard still have higher concentrations of animal allergens. Before getting a pet, ask your allergist to determine if you are allergic to animals. If your pet is already considered part of your family, try to minimize contact and keep the pet out of the bedroom and other rooms where you spend a great deal of time. As with dust mites, vacuum carpets often or replace carpet with a hardwood floor, tile or linoleum. High-efficiency particulate air (HEPA) cleaners can reduce allergen levels over time. While dander and saliva are the source of cat and dog allergens, urine is the source of allergens from rabbits, hamsters, mice and [place] pigs; so ask a non-allergic family member to clean the animal's cage. If you have a pet allergy, talk to your allergist about the potential for allergy immunotherapy (allergy shots). This strategy can often provide long-term relief. Cockroach Allergen Avoidance Cockroaches are often found in the homes of densely populated urban areas, schools or commercial buildings, but these creatures can lurk almost anywhere. This does not mean that you have a dirty house or living area. Block all areas where roaches can enter the home. This includes crevices, wall cracks and windows.  Cockroaches need water to survive, so fix and seal all leaky faucets and pipes. Have an exterminator go through the house when your family and pets are gone to eliminate any remaining roaches. Keep food in lidded containers and  put pet food dishes away after your pets are done eating. Vacuum and sweep the floor after meals, and take out garbage and recyclables. Use lidded garbage containers in the kitchen. Wash dishes immediately after use and clean under stoves, refrigerators or toasters where crumbs can accumulate. Wipe off the stove and other kitchen surfaces and cupboards  regularly.

## 2021-06-30 NOTE — Assessment & Plan Note (Signed)
   Continue lifestyle and dietary modifications.  Start omeprazole 20mg  daily in the mornings and nothing to eat or drink.

## 2021-06-30 NOTE — Assessment & Plan Note (Signed)
Past history - Main complaint of chronic coughing with clear phlegm, chest tightness, shortness of breath, wheezing and nocturnal awakening for a few years.  Worse in the morning after brushing teeth and after meals.  Symptoms last for 1 to 2 hours.  Tried taking Nexium for 14 days with no benefit.  No recent chest x-ray.  Never tried inhalers.   Interim history - azelastine helped nasal symptoms but not the coughing, albuterol helped the chest tightness but no the coughing. Worse at night and after meals.  Today's spirometry was normal.  Given clinical history will do a trial of PPI and ICS/LABA. . Daily controller medication(s): START Airduo digihaler 1 puff twice a day and rinse mouth after each use. Sample given and demonstrated proper use. . May use albuterol rescue inhaler 2 puffs every 4 to 6 hours as needed for shortness of breath, chest tightness, coughing, and wheezing. Monitor frequency of use.   Get spirometry at next visit.  If no improvement, will refer to ENT next to look at vocal cords and get CXR.

## 2021-09-01 ENCOUNTER — Ambulatory Visit (INDEPENDENT_AMBULATORY_CARE_PROVIDER_SITE_OTHER): Payer: BC Managed Care – PPO | Admitting: Allergy

## 2021-09-01 ENCOUNTER — Other Ambulatory Visit: Payer: Self-pay

## 2021-09-01 ENCOUNTER — Telehealth: Payer: Self-pay

## 2021-09-01 ENCOUNTER — Encounter: Payer: Self-pay | Admitting: Allergy

## 2021-09-01 ENCOUNTER — Ambulatory Visit: Payer: BC Managed Care – PPO | Admitting: Allergy

## 2021-09-01 VITALS — BP 112/68 | HR 93 | Resp 16

## 2021-09-01 DIAGNOSIS — J302 Other seasonal allergic rhinitis: Secondary | ICD-10-CM

## 2021-09-01 DIAGNOSIS — R053 Chronic cough: Secondary | ICD-10-CM

## 2021-09-01 DIAGNOSIS — J3089 Other allergic rhinitis: Secondary | ICD-10-CM

## 2021-09-01 DIAGNOSIS — R12 Heartburn: Secondary | ICD-10-CM

## 2021-09-01 MED ORDER — MONTELUKAST SODIUM 10 MG PO TABS: 10.0000 mg | ORAL_TABLET | Freq: Every day | ORAL | 5 refills | Status: AC

## 2021-09-01 MED ORDER — AZELASTINE HCL 0.1 % NA SOLN: 1.0000 | Freq: Two times a day (BID) | NASAL | 5 refills | Status: AC | PRN

## 2021-09-01 MED ORDER — AIRDUO DIGIHALER 113-14 MCG/ACT IN AEPB: 1.0000 | INHALATION_SPRAY | Freq: Two times a day (BID) | RESPIRATORY_TRACT | 5 refills | Status: AC

## 2021-09-01 NOTE — Assessment & Plan Note (Signed)
Some improvement of reflux symptoms but coughing is the same.   Continue lifestyle and dietary modifications.  Continue omeprazole 20mg  daily in the mornings and nothing to eat or drink.

## 2021-09-01 NOTE — Progress Notes (Signed)
Follow Up Note  RE: Sonya Bullock MRN: [id number] DOB: 06/26/1996 Date of Office Visit: 09/01/2021  Referring provider: Deatra James, MD Primary care provider: Deatra James, MD  Chief Complaint: Cough (No change )  History of Present Illness: I had the pleasure of seeing Sonya Bullock for a follow up visit at the [facility] on 09/01/2021. She is a 25 y.o. female, who is being followed for chronic coughing, allergic rhinitis and heartburn. Her previous allergy office visit was on 06/30/2021 with Dr. Selena Batten. Today is a regular follow up visit.  Chronic coughing Patient used Airduo 3 times per week once a day and not sure if it helped the shortness of breath. Coughing is still the same. Mainly after eating and exercising. Tried to premedicate prior to exercise with albuterol with no benefit.   Seasonal and perennial allergic rhinitis Taking Xyzal daily and only using azelastine as needed with some benefit.  Heartburn Also taking omeprazole 20mg  which helped the reflux but not the coughing.  Currently lives in [place] but goes to school in [place] and going to be graduating in December. Hoping to get a job in [place].  Assessment and Plan: Sonya Bullock is a 25 y.o. female with: Chronic coughing Past history - Main complaint of chronic coughing with clear phlegm, chest tightness, shortness of breath, wheezing and nocturnal awakening for a few years.  Worse in the morning after brushing teeth and after meals.  Symptoms last for 1 to 2 hours.  Tried taking Nexium for 14 days with no benefit.  Interim history - omeprazole helped the reflux but not the coughing. Did not take inhaler on a daily basis. Today's spirometry was normal. Refer to ENT (Dr. 21) for chronic coughing. Get Chest X-ray for coughing. Daily controller medication(s): START Airduo digihaler Suszanne Conners 1 puff twice a day and rinse mouth after each use.  May use albuterol rescue inhaler 2 puffs every 4 to 6 hours  as needed for shortness of breath, chest tightness, coughing, and wheezing. Monitor frequency of use.  Get spirometry at next visit.  Seasonal and perennial allergic rhinitis Past history - Perennial rhinitis symptoms for 2 years with worse in the winter and fall.  No prior ENT evaluation. 2022 skin testing was: Positive to grass, trees, mold, dust mites, cat, cockroach. Borderline to feathers. Interim history - stable and only using azelastine prn.  Continue environmental control measures as below. Use over the counter antihistamines such as Xyzal (levocetirizine) daily as needed. May take twice a day during allergy flares. May switch antihistamines every few months. Start Singulair (montelukast) 10mg  daily at night. Cautioned that in some children/adults can experience behavioral changes including hyperactivity, agitation, depression, sleep disturbances and suicidal ideations. These side effects are rare, but if you notice them you should notify me and discontinue Singulair (montelukast). Use azelastine nasal spray 1-2 sprays per nostril twice a day as needed for runny nose/drainage. Nasal saline spray (i.e., Simply Saline) or nasal saline lavage (i.e., NeilMed) is recommended as needed and prior to medicated nasal sprays. Consider allergy injections for long term control if above medications do not help the symptoms. Patient lives in [place]. May need to find an allergist more locally.   Heartburn Some improvement of reflux symptoms but coughing is the same.  Continue lifestyle and dietary modifications. Continue omeprazole 20mg  daily in the mornings and nothing to eat or drink.   Return in about 2 months (around 11/02/2021).  Meds ordered this encounter  Medications  montelukast (SINGULAIR) 10 MG tablet    Sig: Take 1 tablet (10 mg total) by mouth at bedtime.    Dispense:  30 tablet    Refill:  5   azelastine (ASTELIN) 0.1 % nasal spray    Sig: Place 1-2 sprays into both nostrils 2  (two) times daily as needed (nasal drainage). Use in each nostril as directed    Dispense:  30 mL    Refill:  5   Fluticasone-Salmeterol,sensor, (AIRDUO DIGIHALER) 113-14 MCG/ACT AEPB    Sig: Inhale 1 puff into the lungs in the morning and at bedtime. Rinse mouth after each use.    Dispense:  1 each    Refill:  5    Lab Orders  No laboratory test(s) ordered today    Diagnostics: Spirometry:  Tracings reviewed. Her effort: Good reproducible efforts. FVC: 2.93L FEV1: 2.67L, 94% predicted FEV1/FVC ratio: 91% Interpretation: Spirometry consistent with normal pattern.  Please see scanned spirometry results for details.  Medication List:  Current Outpatient Medications  Medication Sig Dispense Refill   albuterol (VENTOLIN HFA) 108 (90 Base) MCG/ACT inhaler INHALE 2 PUFFS INTO THE LUNGS EVERY 4 HOURS AS NEEDED FOR WHEEZING OR SHORTNESS OF BREATH 8.5 g 1   azelastine (ASTELIN) 0.1 % nasal spray Place 1-2 sprays into both nostrils 2 (two) times daily as needed (nasal drainage). Use in each nostril as directed 30 mL 5   levocetirizine (XYZAL ALLERGY 24HR) 5 MG tablet Take 1 tablet (5 mg total) by mouth every evening. 30 tablet 5   montelukast (SINGULAIR) 10 MG tablet Take 1 tablet (10 mg total) by mouth at bedtime. 30 tablet 5   omeprazole (PRILOSEC) 20 MG capsule Take 1 capsule (20 mg total) by mouth daily. 30 capsule 3   VIENVA 0.1-20 MG-MCG tablet Take 1 tablet by mouth daily.     Vitamin D, Ergocalciferol, (DRISDOL) 1.25 MG (50000 UNIT) CAPS capsule Take by mouth.     Fluticasone-Salmeterol,sensor, (AIRDUO DIGIHALER) 113-14 MCG/ACT AEPB Inhale 1 puff into the lungs in the morning and at bedtime. Rinse mouth after each use. 1 each 5   No current facility-administered medications for this visit.   Allergies: No Known Allergies I reviewed her past medical history, social history, family history, and environmental history and no significant changes have been reported from her previous  visit.  Review of Systems  Constitutional:  Negative for appetite change, chills, fever and unexpected weight change.  HENT:  Negative for congestion, postnasal drip and rhinorrhea.   Eyes:  Negative for itching.  Respiratory:  Positive for cough. Negative for chest tightness, shortness of breath and wheezing.   Cardiovascular:  Negative for chest pain.  Gastrointestinal:  Negative for abdominal pain.  Genitourinary:  Negative for difficulty urinating.  Skin:  Negative for rash.  Allergic/Immunologic: Positive for environmental allergies.  Neurological:  Negative for headaches.   Objective: BP 112/68   Pulse 93   Resp 16   SpO2 96%  There is no height or weight on file to calculate BMI. Physical Exam Vitals and nursing note reviewed.  Constitutional:      Appearance: Normal appearance. She is well-developed.  HENT:     Head: Normocephalic and atraumatic.     Right Ear: External ear normal.     Left Ear: External ear normal.     Nose: Nose normal.     Mouth/Throat:     Mouth: Mucous membranes are moist.     Pharynx: Oropharynx is clear.  Eyes:  Conjunctiva/sclera: Conjunctivae normal.  Cardiovascular:     Rate and Rhythm: Normal rate and regular rhythm.     Heart sounds: Normal heart sounds. No murmur heard.   No friction rub. No gallop.  Pulmonary:     Effort: Pulmonary effort is normal.     Breath sounds: Normal breath sounds. No wheezing, rhonchi or rales.  Musculoskeletal:     Cervical back: Neck supple.  Skin:    General: Skin is warm.     Findings: No rash.  Neurological:     Mental Status: She is alert and oriented to person, place, and time.  Psychiatric:        Behavior: Behavior normal.  Previous notes and tests were reviewed. The plan was reviewed with the patient/family, and all questions/concerned were addressed.  It was my pleasure to see Anel today and participate in her care. Please feel free to contact me with any questions or  concerns.  Sincerely,  Wyline Mood, DO Allergy & Immunology  [facility] office: [phone] [facility] office: [phone]

## 2021-09-01 NOTE — Assessment & Plan Note (Signed)
Past history - Main complaint of chronic coughing with clear phlegm, chest tightness, shortness of breath, wheezing and nocturnal awakening for a few years.  Worse in the morning after brushing teeth and after meals.  Symptoms last for 1 to 2 hours.  Tried taking Nexium for 14 days with no benefit.  Interim history - omeprazole helped the reflux but not the coughing. Did not take inhaler on a daily basis.  Today's spirometry was normal. . Refer to ENT (Dr. Suszanne Conners) for chronic coughing. . Get Chest X-ray for coughing. . Daily controller medication(s): START Airduo digihaler 1 puff twice a day and rinse mouth after each use.  . May use albuterol rescue inhaler 2 puffs every 4 to 6 hours as needed for shortness of breath, chest tightness, coughing, and wheezing. Monitor frequency of use.   Get spirometry at next visit.

## 2021-09-01 NOTE — Patient Instructions (Addendum)
Coughing: Refer to ENT (Dr. Suszanne Conners) for chronic coughing. Get Chest X-ray for coughing.  Daily controller medication(s): START Airduo digihaler 1 puff twice a day and rinse mouth after each use.  May use albuterol rescue inhaler 2 puffs every 4 to 6 hours as needed for shortness of breath, chest tightness, coughing, and wheezing. Monitor frequency of use.  Breathing control goals:  Full participation in all desired activities (may need albuterol before activity) Albuterol use two times or less a week on average (not counting use with activity) Cough interfering with sleep two times or less a month Oral steroids no more than once a year No hospitalizations   Environmental allergies Past skin testing showed: Positive to grass, trees, mold, dust mites, cat, cockroach. Borderline to feathers. Continue environmental control measures as below. Use over the counter antihistamines such as Xyzal (levocetirizine) daily as needed. May take twice a day during allergy flares. May switch antihistamines every few months. Start Singulair (montelukast) 10mg  daily at night. Cautioned that in some children/adults can experience behavioral changes including hyperactivity, agitation, depression, sleep disturbances and suicidal ideations. These side effects are rare, but if you notice them you should notify me and discontinue Singulair (montelukast). Use azelastine nasal spray 1-2 sprays per nostril twice a day as needed for runny nose/drainage. Nasal saline spray (i.e., Simply Saline) or nasal saline lavage (i.e., NeilMed) is recommended as needed and prior to medicated nasal sprays. Consider allergy injections for long term control if above medications do not help the symptoms.  Heartburn: Continue lifestyle and dietary modifications. Continue omeprazole 20mg  daily in the mornings and nothing to eat or drink.   Follow up in 2 months or sooner if needed.

## 2021-09-01 NOTE — Telephone Encounter (Signed)
Per Dr Selena Batten pt needs to be referred to Dr Suszanne Conners for chronic cough. Thank you

## 2021-09-01 NOTE — Assessment & Plan Note (Signed)
Past history - Perennial rhinitis symptoms for 2 years with worse in the winter and fall.  No prior ENT evaluation. 2022 skin testing was: Positive to grass, trees, mold, dust mites, cat, cockroach. Borderline to feathers. Interim history - stable and only using azelastine prn.   Continue environmental control measures as below.  Use over the counter antihistamines such as Xyzal (levocetirizine) daily as needed. May take twice a day during allergy flares. May switch antihistamines every few months.  Start Singulair (montelukast) 10mg  daily at night.  Cautioned that in some children/adults can experience behavioral changes including hyperactivity, agitation, depression, sleep disturbances and suicidal ideations. These side effects are rare, but if you notice them you should notify me and discontinue Singulair (montelukast).  Use azelastine nasal spray 1-2 sprays per nostril twice a day as needed for runny nose/drainage.  Nasal saline spray (i.e., Simply Saline) or nasal saline lavage (i.e., NeilMed) is recommended as needed and prior to medicated nasal sprays.  Consider allergy injections for long term control if above medications do not help the symptoms.  Patient lives in [place]. May need to find an allergist more locally.

## 2021-09-04 ENCOUNTER — Ambulatory Visit
Admission: RE | Admit: 2021-09-04 | Discharge: 2021-09-04 | Disposition: A | Discharge status: Home / Self Care | Payer: BC Managed Care – PPO | Source: Ambulatory Visit | Attending: Ophthalmology | Admitting: Ophthalmology

## 2021-09-04 ENCOUNTER — Ambulatory Visit
Admission: RE | Admit: 2021-09-04 | Discharge: 2021-09-04 | Disposition: A | Discharge status: Home / Self Care | Payer: BC Managed Care – PPO | Source: Ambulatory Visit | Attending: Allergy | Admitting: Allergy

## 2021-09-04 DIAGNOSIS — R053 Chronic cough: Secondary | ICD-10-CM | POA: Insufficient documentation

## 2021-09-07 NOTE — Telephone Encounter (Signed)
Sonya Bullock called in to get results from chest x-ray.  I informed Sonya Bullock her x-ray came back normal.  Sonya Bullock asked about referral to Dr. Suszanne Conners.  I told her I see a message in her chart about the referral and asked if anyone has reached out to her and she said "no we talked about it doing it but never decided"  So Sonya Bullock would like a referral to Dr. Suszanne Conners.

## 2021-09-07 NOTE — Telephone Encounter (Signed)
Referral has been placed to Dr Avel Sensor office. Patient has been informed of the provider contact information. Patient will give [place] a call back if she doesn't hear anything back from their office.   Thanks

## 2021-11-01 ENCOUNTER — Other Ambulatory Visit: Payer: Self-pay | Admitting: Allergy

## 2021-11-02 NOTE — Progress Notes (Deleted)
Follow Up Note  RE: Sonya Bullock MRN: [id number] DOB: 01-19-1996 Date of Office Visit: 11/03/2021  Referring provider: Donald Prose, MD Primary care provider: Donald Prose, MD  Chief Complaint: No chief complaint on file.  History of Present Illness: I had the pleasure of seeing Sonya Bullock for a follow up visit at the [facility] on 11/02/2021. She is a 26 y.o. female, who is being followed for chronic coughing, allergic rhinitis and heartburn. Her previous allergy office visit was on 09/01/2021 with Dr. Maudie Mercury. Today is a regular follow up visit.  Chronic coughing Past history - Main complaint of chronic coughing with clear phlegm, chest tightness, shortness of breath, wheezing and nocturnal awakening for a few years.  Worse in the morning after brushing teeth and after meals.  Symptoms last for 1 to 2 hours.  Tried taking Nexium for 14 days with no benefit.  Interim history - omeprazole helped the reflux but not the coughing. Did not take inhaler on a daily basis. Today's spirometry was normal. Refer to ENT (Dr. Benjamine Mola) for chronic coughing. Get Chest X-ray for coughing. Daily controller medication(s): START Airduo digihaler 165mcg 1 puff twice a day and rinse mouth after each use.  May use albuterol rescue inhaler 2 puffs every 4 to 6 hours as needed for shortness of breath, chest tightness, coughing, and wheezing. Monitor frequency of use.  Get spirometry at next visit.   Seasonal and perennial allergic rhinitis Past history - Perennial rhinitis symptoms for 2 years with worse in the winter and fall.  No prior ENT evaluation. 2022 skin testing was: Positive to grass, trees, mold, dust mites, cat, cockroach. Borderline to feathers. Interim history - stable and only using azelastine prn.  Continue environmental control measures as below. Use over the counter antihistamines such as Xyzal (levocetirizine) daily as needed. May take twice a day during allergy flares. May  switch antihistamines every few months. Start Singulair (montelukast) 10mg  daily at night. Cautioned that in some children/adults can experience behavioral changes including hyperactivity, agitation, depression, sleep disturbances and suicidal ideations. These side effects are rare, but if you notice them you should notify me and discontinue Singulair (montelukast). Use azelastine nasal spray 1-2 sprays per nostril twice a day as needed for runny nose/drainage. Nasal saline spray (i.e., Simply Saline) or nasal saline lavage (i.e., NeilMed) is recommended as needed and prior to medicated nasal sprays. Consider allergy injections for long term control if above medications do not help the symptoms. Patient lives in [place]. May need to find an allergist more locally.    Heartburn Some improvement of reflux symptoms but coughing is the same.  Continue lifestyle and dietary modifications. Continue omeprazole 20mg  daily in the mornings and nothing to eat or drink.   09/04/2021 chest x-ray: "IMPRESSION: No active cardiopulmonary disease."  Assessment and Plan: Sonya Bullock is a 26 y.o. female with: No problem-specific Assessment & Plan notes found for this encounter.  No follow-ups on file.  No orders of the defined types were placed in this encounter.  Lab Orders  No laboratory test(s) ordered today    Diagnostics: Spirometry:  Tracings reviewed. Her effort: {Blank single:19197::"Good reproducible efforts.","It was hard to get consistent efforts and there is a question as to whether this reflects a maximal maneuver.","Poor effort, data can not be interpreted."} FVC: ***L FEV1: ***L, ***% predicted FEV1/FVC ratio: ***% Interpretation: {Blank single:19197::"Spirometry consistent with mild obstructive disease","Spirometry consistent with moderate obstructive disease","Spirometry consistent with severe obstructive disease","Spirometry consistent with possible restrictive  disease","Spirometry  consistent with mixed obstructive and restrictive disease","Spirometry uninterpretable due to technique","Spirometry consistent with normal pattern","No overt abnormalities noted given today's efforts"}.  Please see scanned spirometry results for details.  Skin Testing: {Blank single:19197::"Select foods","Environmental allergy panel","Environmental allergy panel and select foods","Food allergy panel","None","Deferred due to recent antihistamines use"}. *** Results discussed with patient/family.   Medication List:  Current Outpatient Medications  Medication Sig Dispense Refill   albuterol (VENTOLIN HFA) 108 (90 Base) MCG/ACT inhaler INHALE 2 PUFFS INTO THE LUNGS EVERY 4 HOURS AS NEEDED FOR WHEEZING OR SHORTNESS OF BREATH 8.5 g 1   azelastine (ASTELIN) 0.1 % nasal spray Place 1-2 sprays into both nostrils 2 (two) times daily as needed (nasal drainage). Use in each nostril as directed 30 mL 5   Fluticasone-Salmeterol,sensor, (AIRDUO DIGIHALER) 113-14 MCG/ACT AEPB Inhale 1 puff into the lungs in the morning and at bedtime. Rinse mouth after each use. 1 each 5   levocetirizine (XYZAL ALLERGY 24HR) 5 MG tablet Take 1 tablet (5 mg total) by mouth every evening. 30 tablet 5   montelukast (SINGULAIR) 10 MG tablet Take 1 tablet (10 mg total) by mouth at bedtime. 30 tablet 5   omeprazole (PRILOSEC) 20 MG capsule Take 1 capsule (20 mg total) by mouth daily. 30 capsule 3   VIENVA 0.1-20 MG-MCG tablet Take 1 tablet by mouth daily.     Vitamin D, Ergocalciferol, (DRISDOL) 1.25 MG (50000 UNIT) CAPS capsule Take by mouth.     No current facility-administered medications for this visit.   Allergies: No Known Allergies I reviewed her past medical history, social history, family history, and environmental history and no significant changes have been reported from her previous visit.  Review of Systems  Constitutional:  Negative for appetite change, chills, fever and unexpected weight change.  HENT:   Negative for congestion, postnasal drip and rhinorrhea.   Eyes:  Negative for itching.  Respiratory:  Positive for cough. Negative for chest tightness, shortness of breath and wheezing.   Cardiovascular:  Negative for chest pain.  Gastrointestinal:  Negative for abdominal pain.  Genitourinary:  Negative for difficulty urinating.  Skin:  Negative for rash.  Allergic/Immunologic: Positive for environmental allergies.  Neurological:  Negative for headaches.   Objective: There were no vitals taken for this visit. There is no height or weight on file to calculate BMI. Physical Exam Vitals and nursing note reviewed.  Constitutional:      Appearance: Normal appearance. She is well-developed.  HENT:     Head: Normocephalic and atraumatic.     Right Ear: External ear normal.     Left Ear: External ear normal.     Nose: Nose normal.     Mouth/Throat:     Mouth: Mucous membranes are moist.     Pharynx: Oropharynx is clear.  Eyes:     Conjunctiva/sclera: Conjunctivae normal.  Cardiovascular:     Rate and Rhythm: Normal rate and regular rhythm.     Heart sounds: Normal heart sounds. No murmur heard.   No friction rub. No gallop.  Pulmonary:     Effort: Pulmonary effort is normal.     Breath sounds: Normal breath sounds. No wheezing, rhonchi or rales.  Musculoskeletal:     Cervical back: Neck supple.  Skin:    General: Skin is warm.     Findings: No rash.  Neurological:     Mental Status: She is alert and oriented to person, place, and time.  Psychiatric:        Behavior: Behavior  normal.   Previous notes and tests were reviewed. The plan was reviewed with the patient/family, and all questions/concerned were addressed.  It was my pleasure to see Ellyse today and participate in her care. Please feel free to contact me with any questions or concerns.  Sincerely,  Rexene Alberts, DO Allergy & Immunology  [facility] office:  [place] office: [phone]

## 2021-11-03 ENCOUNTER — Other Ambulatory Visit: Payer: Self-pay

## 2021-11-03 ENCOUNTER — Ambulatory Visit: Payer: BC Managed Care – PPO | Admitting: Allergy

## 2021-11-03 DIAGNOSIS — J302 Other seasonal allergic rhinitis: Secondary | ICD-10-CM

## 2021-11-03 DIAGNOSIS — R12 Heartburn: Secondary | ICD-10-CM

## 2021-11-03 DIAGNOSIS — R053 Chronic cough: Secondary | ICD-10-CM

## 2021-12-15 ENCOUNTER — Other Ambulatory Visit: Payer: Self-pay | Admitting: *Deleted

## 2021-12-15 ENCOUNTER — Other Ambulatory Visit: Payer: Self-pay | Admitting: Allergy and Immunology

## 2021-12-15 MED ORDER — OMEPRAZOLE 20 MG PO CPDR: DELAYED_RELEASE_CAPSULE | ORAL | 5 refills | Status: AC

## 2022-04-26 ENCOUNTER — Other Ambulatory Visit: Payer: Self-pay | Admitting: Family Medicine

## 2022-04-26 DIAGNOSIS — Z8639 Personal history of other endocrine, nutritional and metabolic disease: Secondary | ICD-10-CM

## 2022-04-29 ENCOUNTER — Ambulatory Visit
Admission: RE | Admit: 2022-04-29 | Discharge: 2022-04-29 | Disposition: A | Discharge status: Home / Self Care | Payer: BC Managed Care – PPO | Source: Ambulatory Visit | Attending: Family Medicine | Admitting: Family Medicine

## 2022-04-29 DIAGNOSIS — Z8639 Personal history of other endocrine, nutritional and metabolic disease: Secondary | ICD-10-CM

## 2023-02-13 IMAGING — CR DG CHEST 2V
1 series · 2 of 2 positions shown · non-contrast
Comparison: None.

CLINICAL DATA: Cough - R05.

EXAM:
CHEST - 2 VIEW

[Series 1: dg chest 2 view · 0.14mm/px · 2 of 2 slices shown]
[im 1/2]
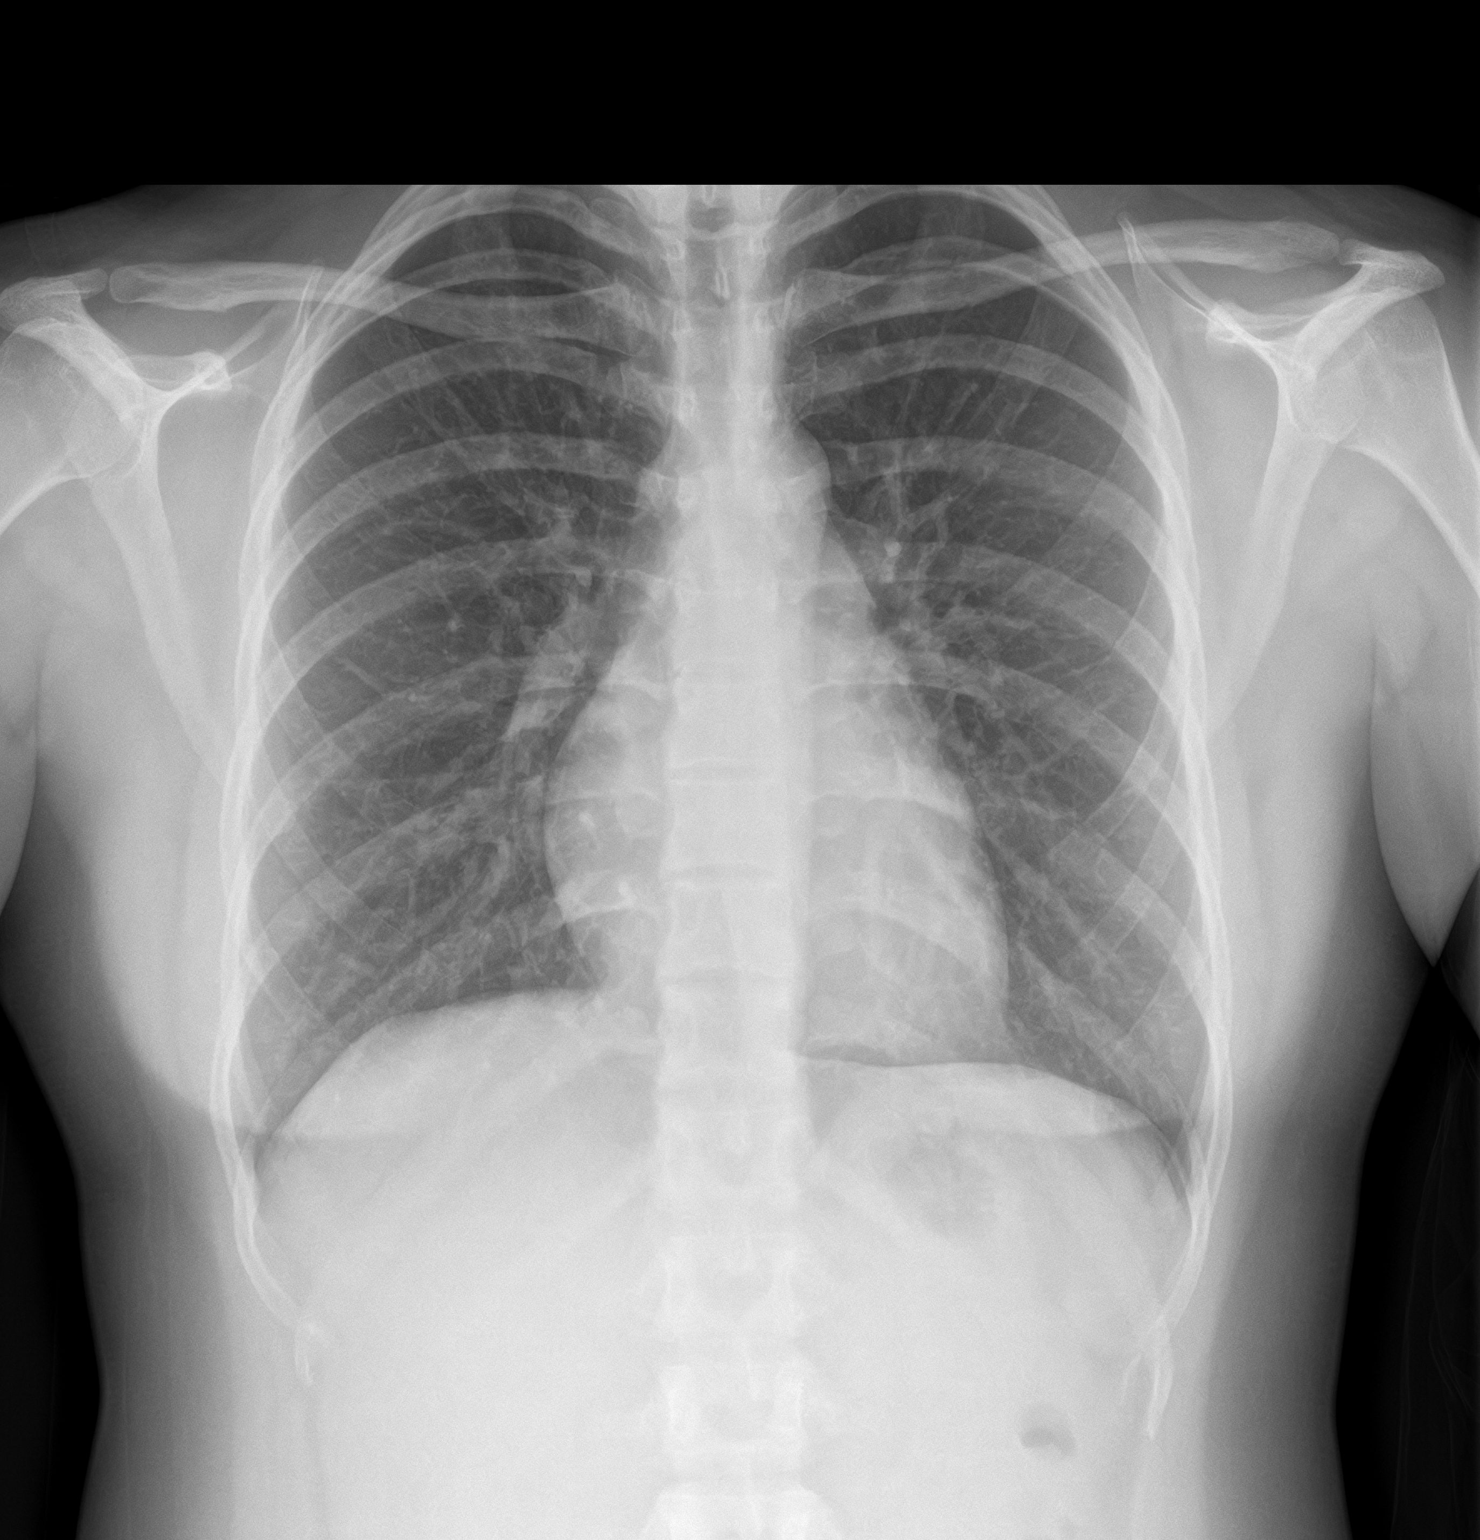
[im 2/2]
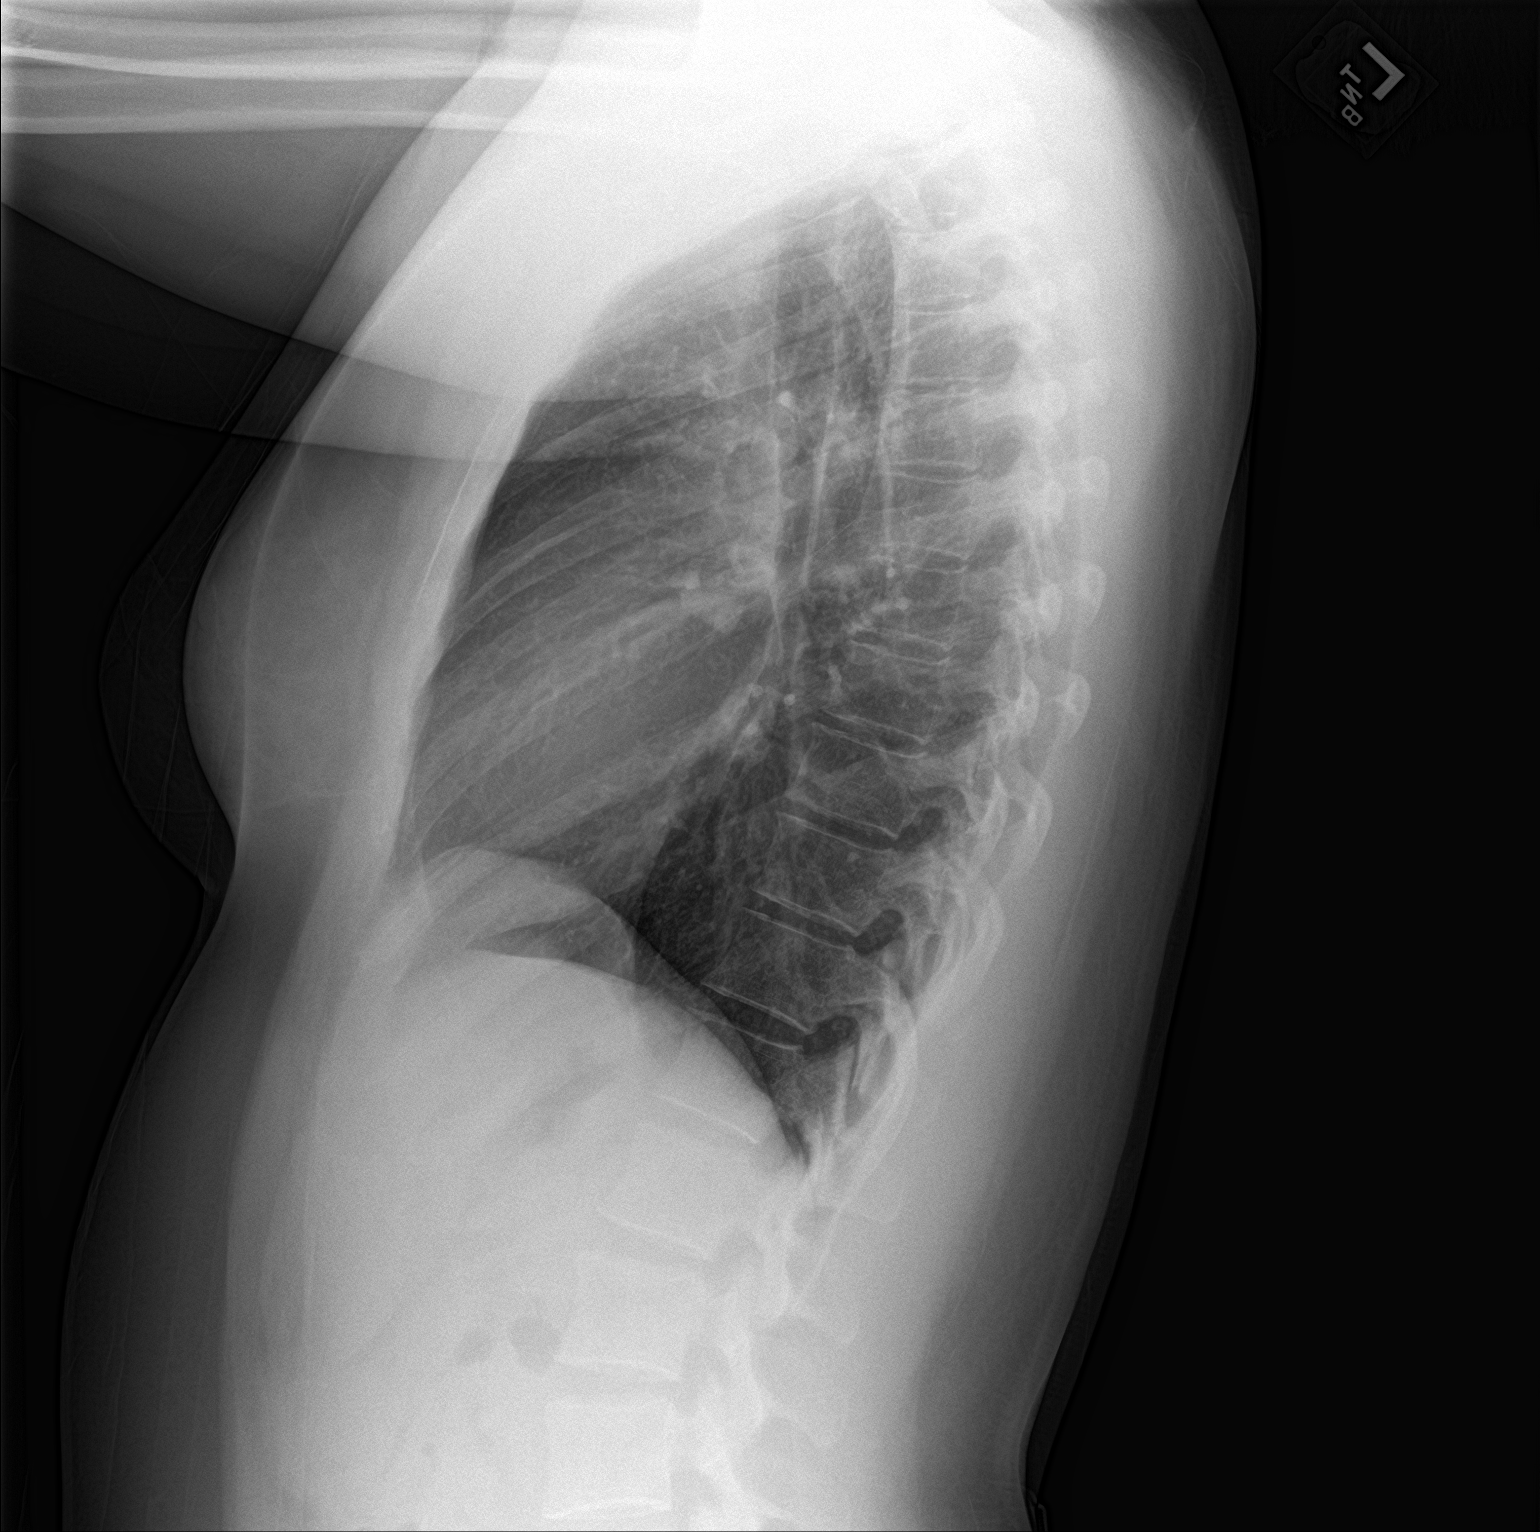

[2 of 2 positions shown; findings below may reference images not displayed]

FINDINGS: The heart size and mediastinal contours are within normal limits.
Both lungs are clear. The visualized skeletal structures are
unremarkable.
IMPRESSION: No active cardiopulmonary disease.
